# Patient Record
Sex: Female | Born: 1985 | Race: Black or African American | Hispanic: No | Marital: Single | Smoking: Never smoker
Health system: Southern US, Community
[De-identification: ages and names within clinical notes are randomized; demographics above are authoritative.]

## PROBLEM LIST (undated history)

## (undated) DIAGNOSIS — E282 Polycystic ovarian syndrome: Secondary | ICD-10-CM

---

## 2012-11-04 ENCOUNTER — Emergency Department: Payer: Self-pay

## 2012-11-04 LAB — CBC
HCT: 30 % — ABNORMAL LOW (ref 35.0–47.0)
HGB: 9.8 g/dL — ABNORMAL LOW (ref 12.0–16.0)
MCH: 28.4 pg (ref 26.0–34.0)
MCV: 87 fL (ref 80–100)
Platelet: 295 10*3/uL (ref 150–440)
RBC: 3.44 10*6/uL — ABNORMAL LOW (ref 3.80–5.20)

## 2012-11-04 LAB — URINALYSIS, COMPLETE
Bacteria: NONE SEEN
Bilirubin,UR: NEGATIVE
Leukocyte Esterase: NEGATIVE
Nitrite: NEGATIVE
Ph: 7 (ref 4.5–8.0)
Protein: NEGATIVE
Specific Gravity: 1.003 (ref 1.003–1.030)
Squamous Epithelial: <1
WBC UR: <1 /HPF (ref 0–5)

## 2013-01-21 ENCOUNTER — Ambulatory Visit: Payer: Self-pay | Admitting: Family Medicine

## 2013-01-21 DIAGNOSIS — Z0289 Encounter for other administrative examinations: Secondary | ICD-10-CM

## 2013-06-01 ENCOUNTER — Emergency Department: Payer: Self-pay | Admitting: Emergency Medicine

## 2013-06-01 LAB — CBC WITH DIFFERENTIAL/PLATELET
BASOS ABS: 0.1 10*3/uL (ref 0.0–0.1)
Basophil %: 0.5 %
EOS ABS: 0.1 10*3/uL (ref 0.0–0.7)
EOS PCT: 1.1 %
HCT: 34.1 % — ABNORMAL LOW (ref 35.0–47.0)
HGB: 11.3 g/dL — ABNORMAL LOW (ref 12.0–16.0)
LYMPHS ABS: 2.5 10*3/uL (ref 1.0–3.6)
LYMPHS PCT: 23.1 %
MCH: 27.8 pg (ref 26.0–34.0)
MCHC: 33.2 g/dL (ref 32.0–36.0)
MCV: 84 fL (ref 80–100)
Monocyte #: 0.7 x10 3/mm (ref 0.2–0.9)
Monocyte %: 6.1 %
NEUTROS ABS: 7.6 10*3/uL — AB (ref 1.4–6.5)
Neutrophil %: 69.2 %
Platelet: 265 10*3/uL (ref 150–440)
RBC: 4.06 10*6/uL (ref 3.80–5.20)
RDW: 16.4 % — ABNORMAL HIGH (ref 11.5–14.5)
WBC: 10.9 10*3/uL (ref 3.6–11.0)

## 2013-06-01 LAB — URINALYSIS, COMPLETE
Bilirubin,UR: NEGATIVE
Blood: NEGATIVE
GLUCOSE, UR: NEGATIVE mg/dL (ref 0–75)
Ketone: NEGATIVE
Leukocyte Esterase: NEGATIVE
Nitrite: NEGATIVE
Ph: 6 (ref 4.5–8.0)
Protein: 25
RBC,UR: <1 /HPF (ref 0–5)
SPECIFIC GRAVITY: 1.03 (ref 1.003–1.030)
Squamous Epithelial: 3
WBC UR: 2 /HPF (ref 0–5)

## 2013-06-01 LAB — BASIC METABOLIC PANEL
Anion Gap: 7 (ref 7–16)
BUN: 10 mg/dL (ref 7–18)
CREATININE: 0.77 mg/dL (ref 0.60–1.30)
Calcium, Total: 8.7 mg/dL (ref 8.5–10.1)
Chloride: 105 mmol/L (ref 98–107)
Co2: 25 mmol/L (ref 21–32)
EGFR (African American): >60
GLUCOSE: 74 mg/dL (ref 65–99)
Osmolality: 272 (ref 275–301)
POTASSIUM: 3.5 mmol/L (ref 3.5–5.1)
SODIUM: 137 mmol/L (ref 136–145)

## 2013-06-01 LAB — PREGNANCY, URINE: Pregnancy Test, Urine: POSITIVE m[IU]/mL

## 2013-06-01 LAB — HCG, QUANTITATIVE, PREGNANCY: BETA HCG, QUANT.: 31223 m[IU]/mL — AB

## 2013-06-01 LAB — TROPONIN I: Troponin-I: <0.02 ng/mL

## 2013-11-06 ENCOUNTER — Observation Stay: Payer: Self-pay | Admitting: Obstetrics and Gynecology

## 2013-11-22 IMAGING — US US PELV - US TRANSVAGINAL
1 series · 14 of 25 positions shown · non-contrast
Comparison: none

REASON FOR EXAM: vaginal bleeding x 2 mo
COMMENTS:

[Series 1: us pelv - us transvaginal · 0.28mm/px · 14 of 56 slices shown]
[im 1/56]
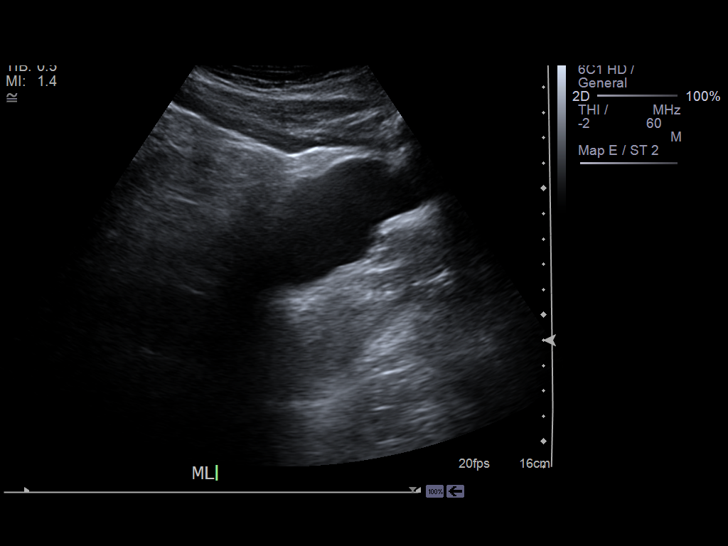
[im 5/56]
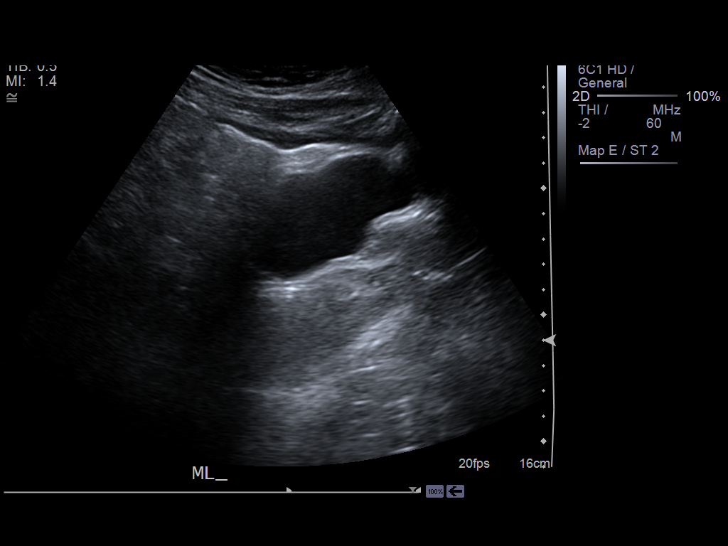
[im 10/56]
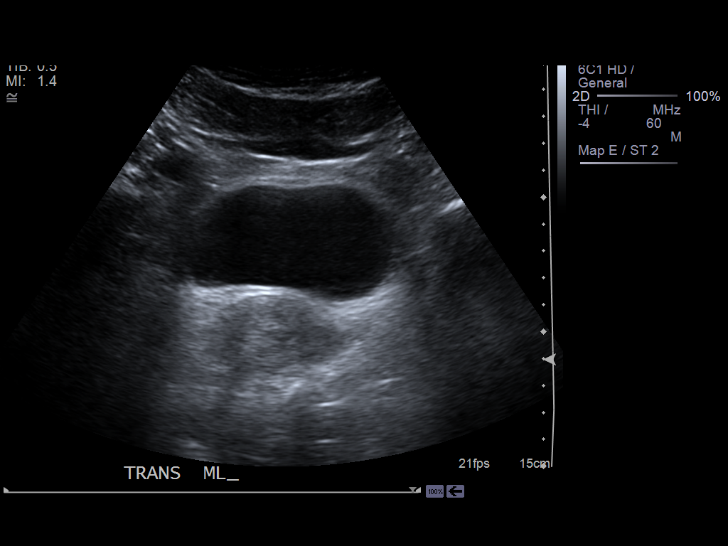
[im 14/56]
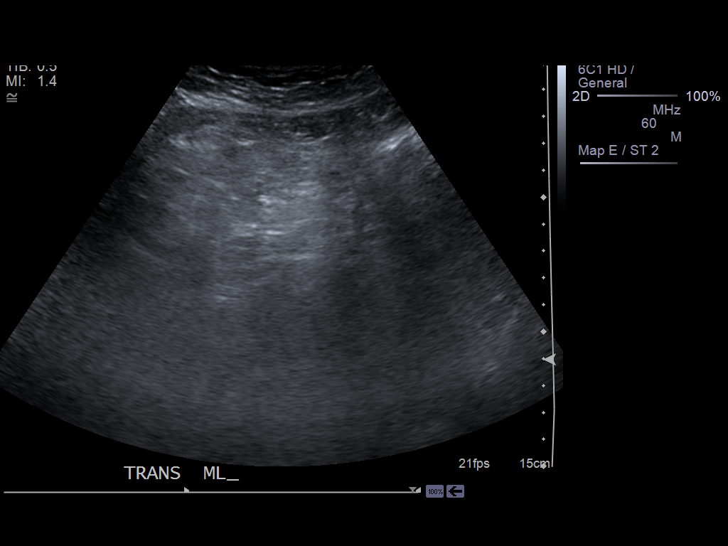
[im 19/56]
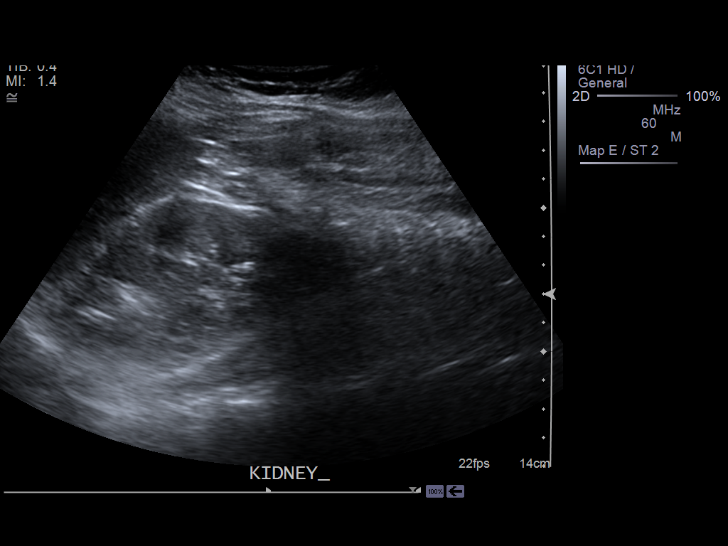
[im 21/56]
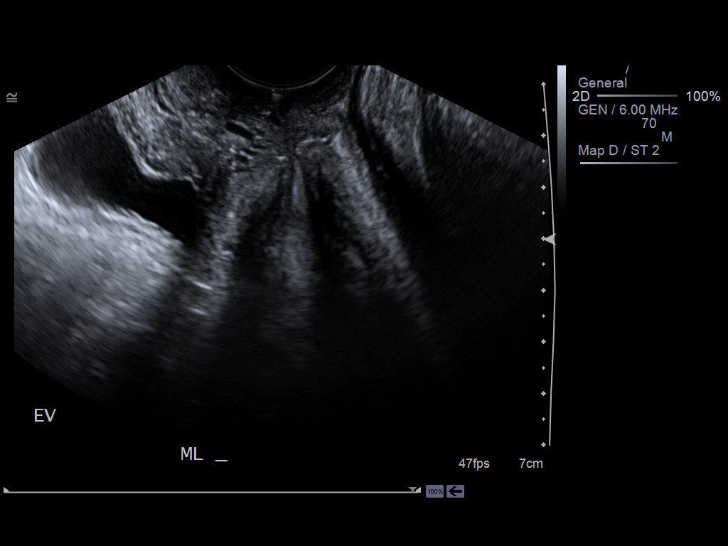
[im 26/56]
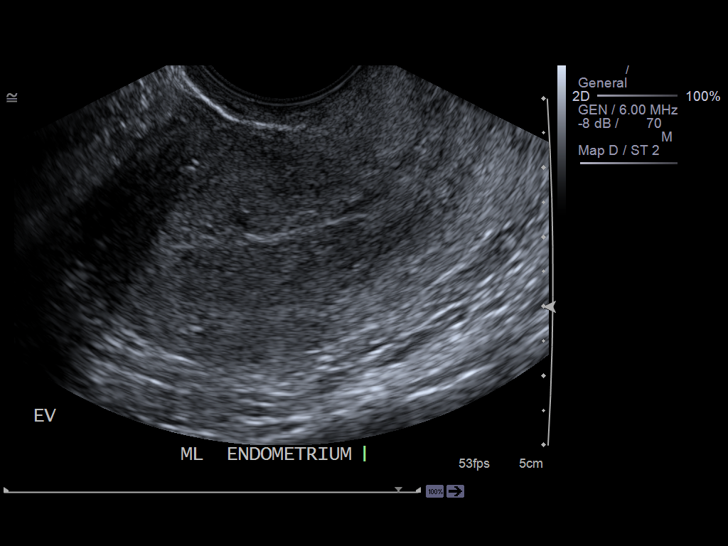
[im 30/56]
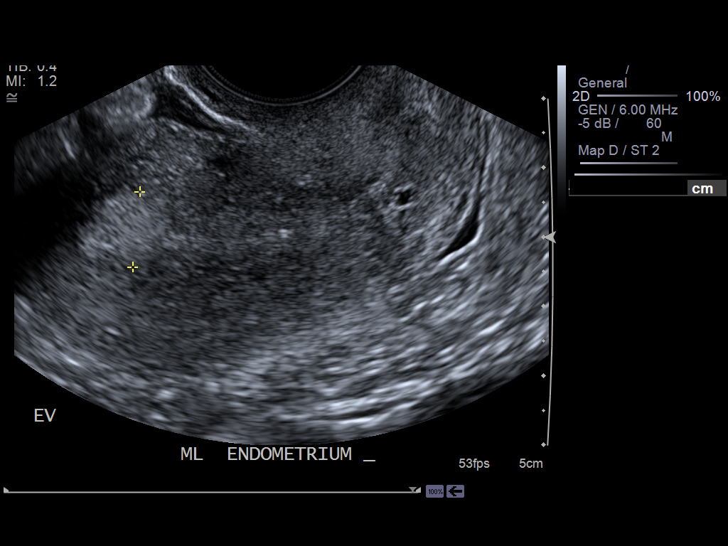
[im 35/56]
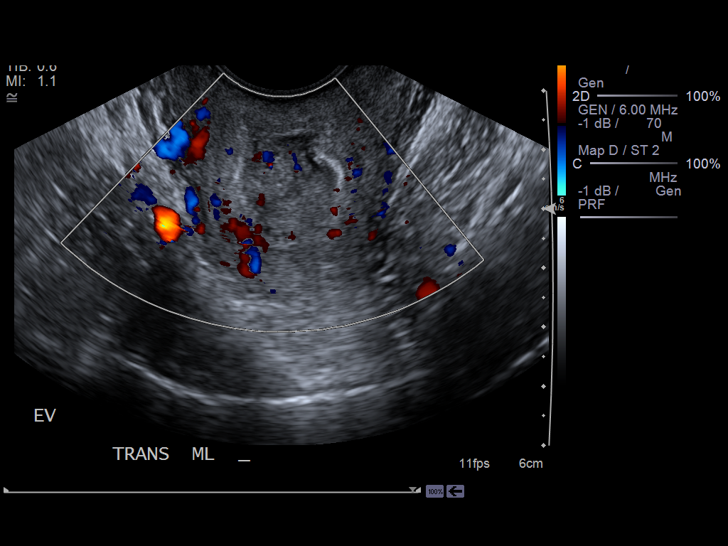
[im 37/56]
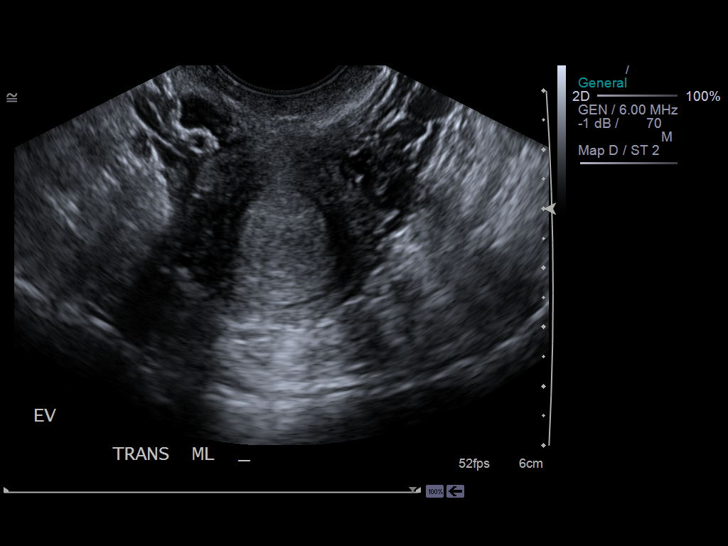
[im 42/56]
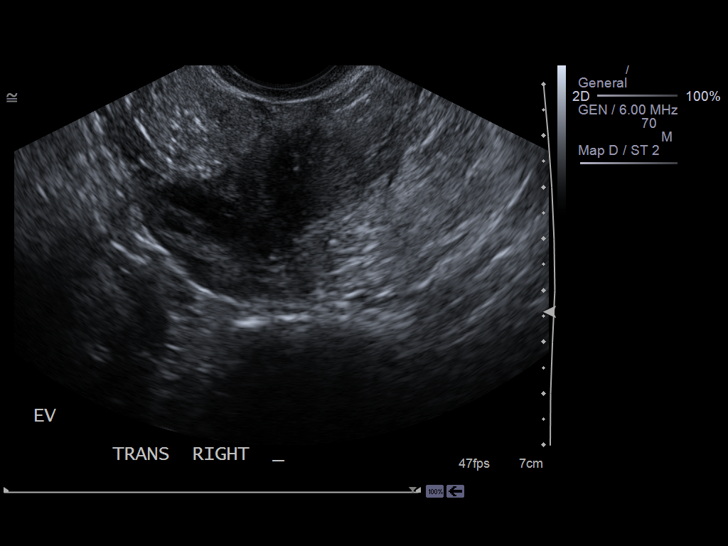
[im 46/56]
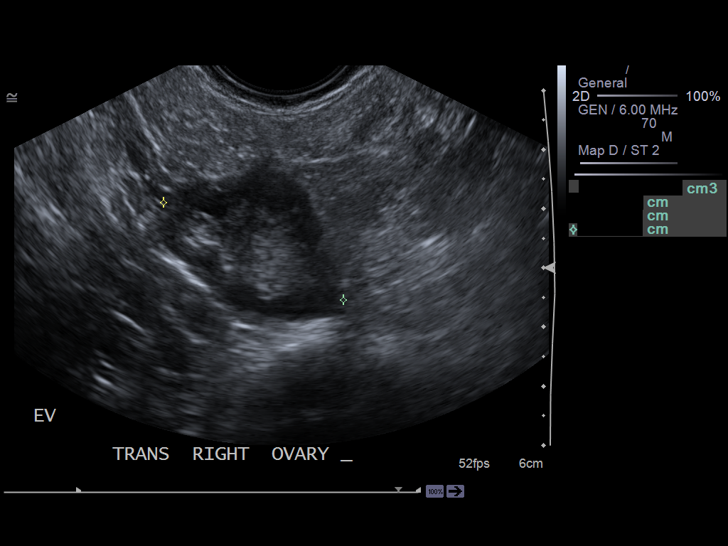
[im 51/56]
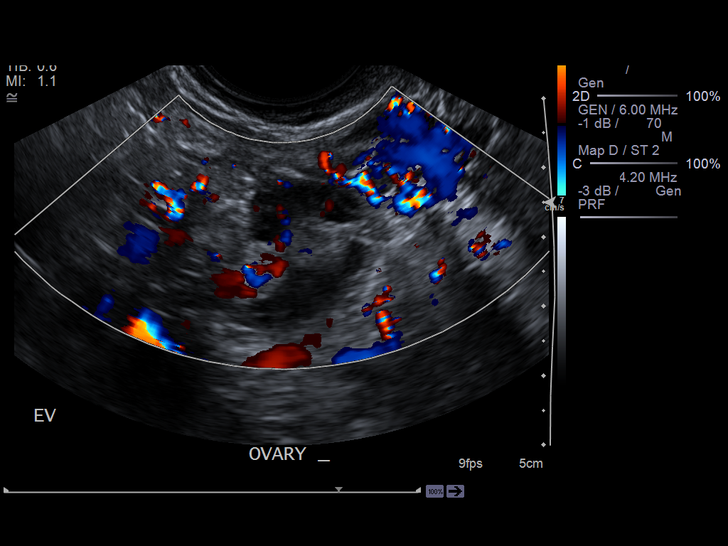
[im 56/56]
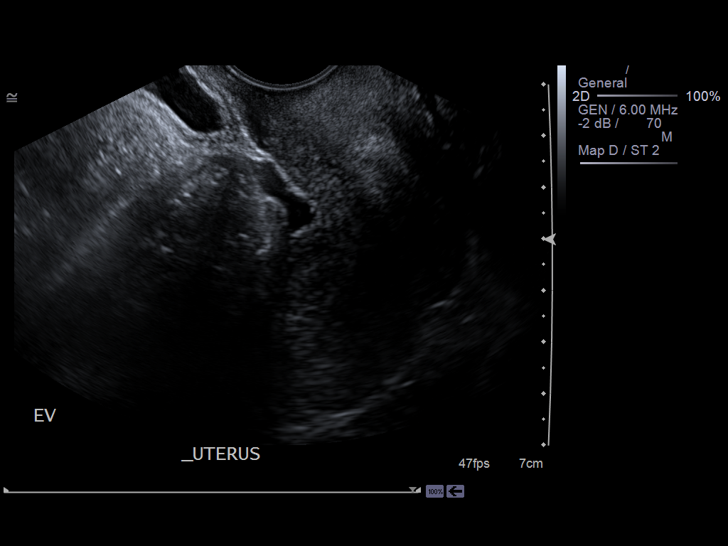

[14 of 25 positions shown; findings below may reference images not displayed]

PROCEDURE:     US  - US PELVIS EXAM W/TRANSVAGINAL  - November 04, 2012  [DATE]

RESULT:     Transabdominal and transvaginal imaging techniques were employed
evaluate the pelvis.

The uterus is normal in echotexture. It measures 6.8 x 4.5 x 3.4 cm. The
endometrium is thickened at 11 mm. There is a small amount of free fluid in
the cul-de-sac.

The right ovary measures 3 x 2.5 x 3.5 cm. The left ovary measures 3.7 x
x 2.9 cm. Vascularity of the ovaries is normal. There are multiple tiny
cysts or developing follicles in both ovaries.
IMPRESSION: 1. There is thickening of the endometrial stripe. Correlation with the
timing of the patient's menstrual cycle is needed.
2. There are multiple tiny cysts versus follicles in both ovaries. The
findings could reflect polycystic ovary syndrome in the appropriate clinical
setting.
3. There is a small amount of free fluid in the cul-de-sac.

[REDACTED]

## 2013-12-11 ENCOUNTER — Observation Stay: Payer: Self-pay | Admitting: Obstetrics and Gynecology

## 2013-12-11 LAB — URINALYSIS, COMPLETE
Bilirubin,UR: NEGATIVE
Blood: NEGATIVE
Glucose,UR: NEGATIVE mg/dL (ref 0–75)
Ketone: NEGATIVE
Nitrite: NEGATIVE
PH: 6 (ref 4.5–8.0)
Protein: NEGATIVE
SPECIFIC GRAVITY: 1.019 (ref 1.003–1.030)
Squamous Epithelial: 34
WBC UR: 11 /HPF (ref 0–5)

## 2014-01-26 ENCOUNTER — Inpatient Hospital Stay: Payer: Self-pay | Admitting: Obstetrics and Gynecology

## 2014-01-26 LAB — CBC WITH DIFFERENTIAL/PLATELET
Basophil #: 0 10*3/uL (ref 0.0–0.1)
Basophil %: 0.2 %
EOS PCT: 0.3 %
Eosinophil #: 0 10*3/uL (ref 0.0–0.7)
HCT: 38.6 % (ref 35.0–47.0)
HGB: 12.3 g/dL (ref 12.0–16.0)
LYMPHS ABS: 1.5 10*3/uL (ref 1.0–3.6)
Lymphocyte %: 14.7 %
MCH: 29.3 pg (ref 26.0–34.0)
MCHC: 31.9 g/dL — ABNORMAL LOW (ref 32.0–36.0)
MCV: 92 fL (ref 80–100)
Monocyte #: 0.6 x10 3/mm (ref 0.2–0.9)
Monocyte %: 6.4 %
Neutrophil #: 7.8 10*3/uL — ABNORMAL HIGH (ref 1.4–6.5)
Neutrophil %: 78.4 %
Platelet: 191 10*3/uL (ref 150–440)
RBC: 4.21 10*6/uL (ref 3.80–5.20)
RDW: 13.6 % (ref 11.5–14.5)
WBC: 10 10*3/uL (ref 3.6–11.0)

## 2014-01-28 LAB — HEMATOCRIT: HCT: 30.6 % — ABNORMAL LOW (ref 35.0–47.0)

## 2014-06-19 IMAGING — US US OB < 14 WEEKS - US OB TV
1 series · 14 of 28 positions shown · non-contrast
Comparison: Pelvic ultrasound November 04, 2012

CLINICAL DATA: Abdominal pain and cramping. Pregnancy, 9 weeks 5
days by last menstrual period.

EXAM:
OBSTETRIC <14 WK US AND TRANSVAGINAL OB US
TECHNIQUE: Both transabdominal and transvaginal ultrasound examinations were
performed for complete evaluation of the gestation as well as the
maternal uterus, adnexal regions, and pelvic cul-de-sac.
Transvaginal technique was performed to assess early pregnancy.

[Series 1: us ob < 14 weeks - us ob tv · 0.22mm/px · 39 acquisitions, 14 frames shown]
[im 2/39]
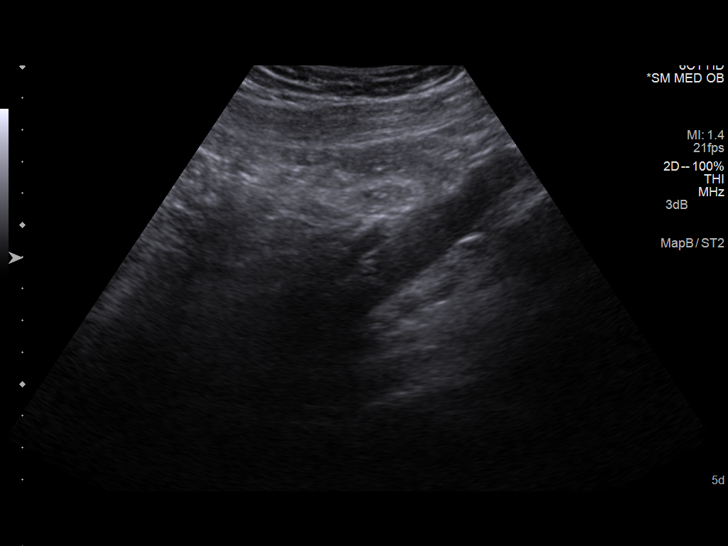
[im 5/39]
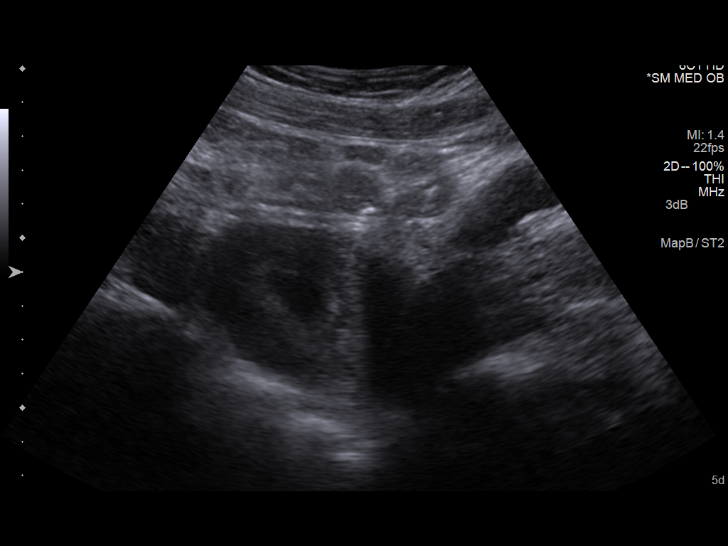
[im 8/39]
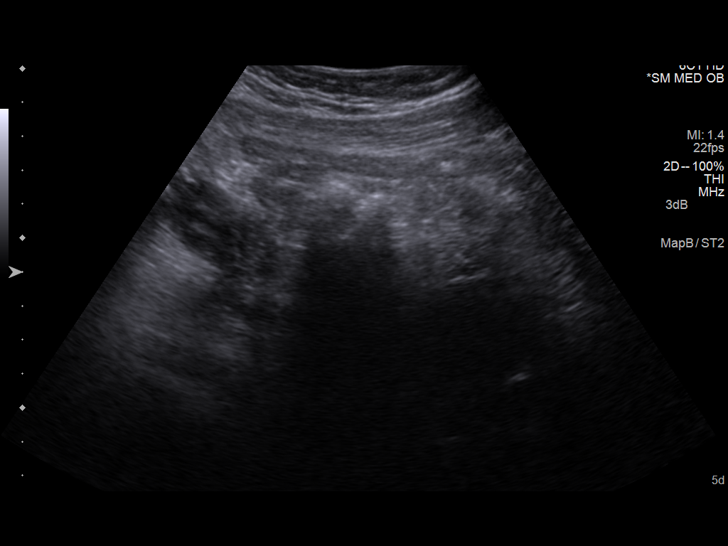
[im 10/39]
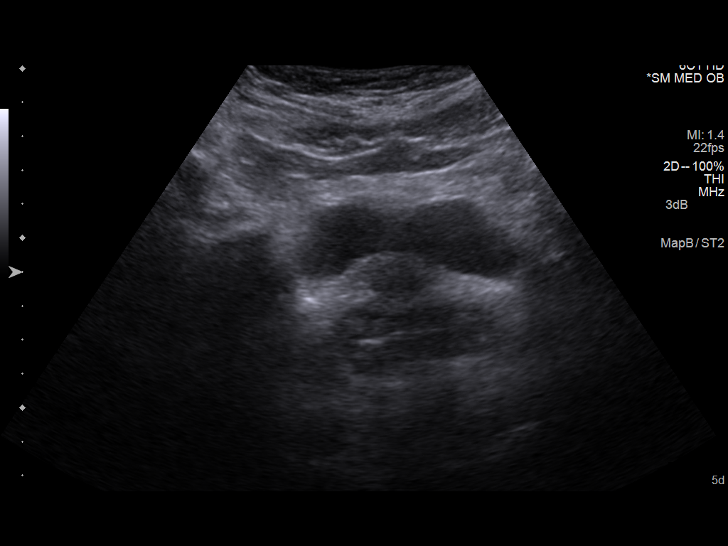
[im 13/39]
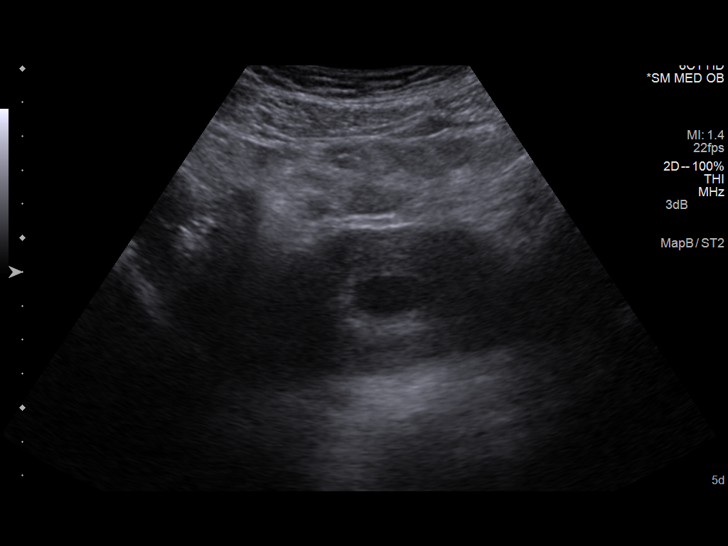
[im 16/39]
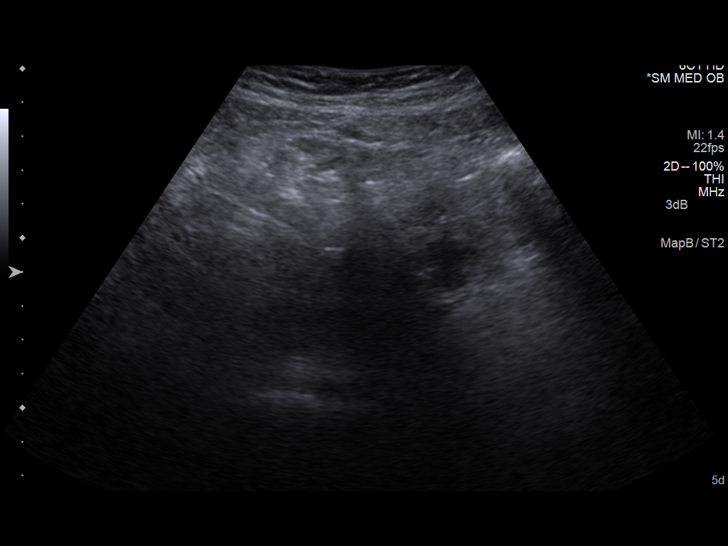
[im 19/39]
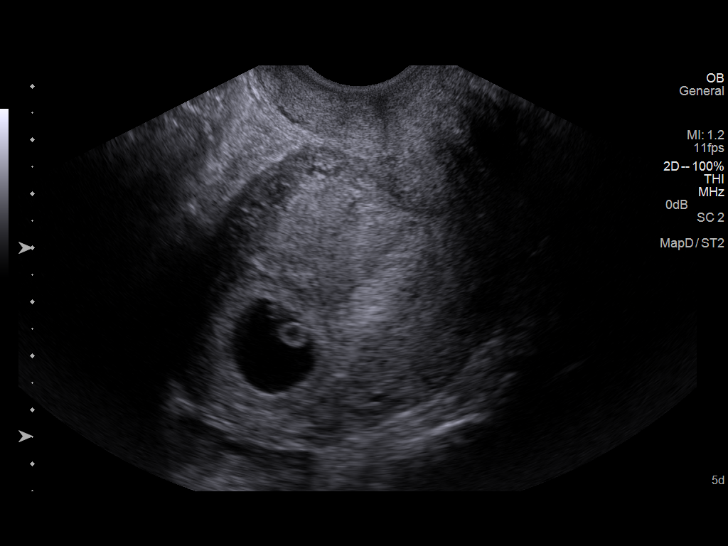
[im 22/39]
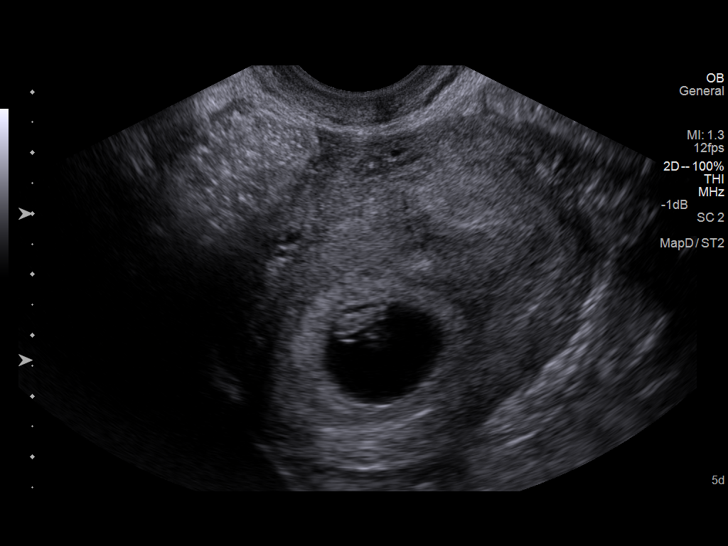
[im 24/39]
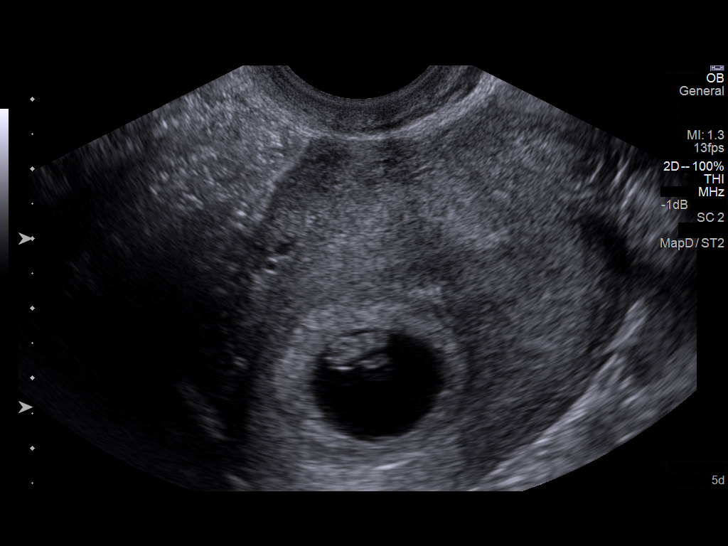
[im 27/39]
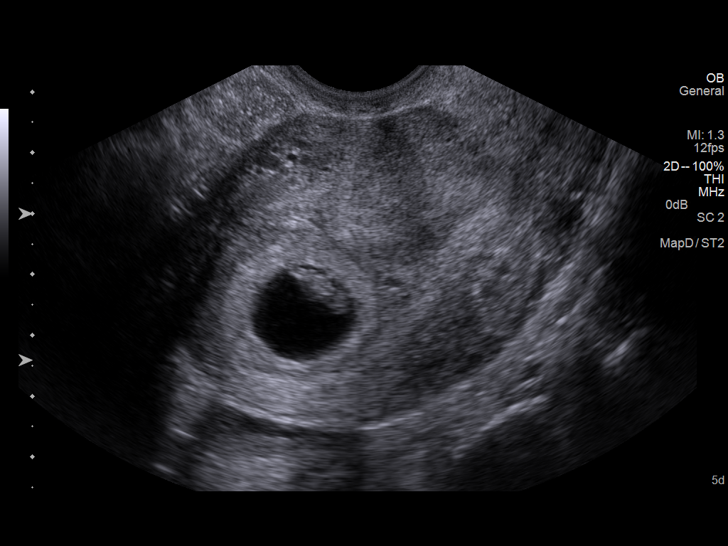
[im 30/39]
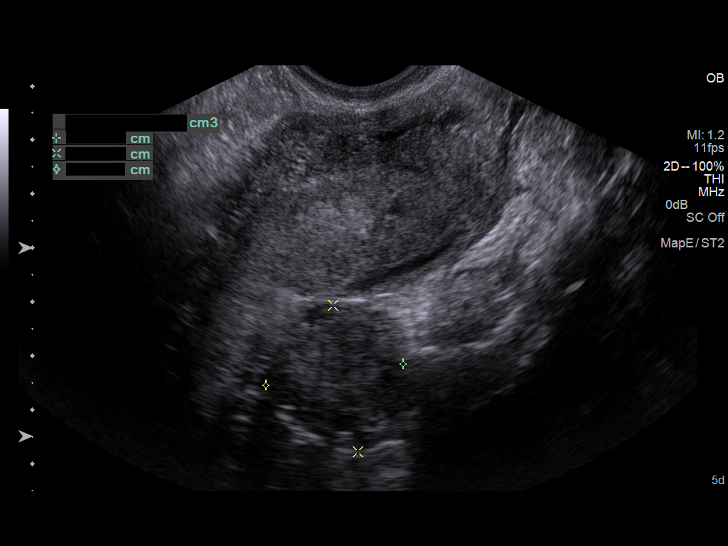
[im 33/39]
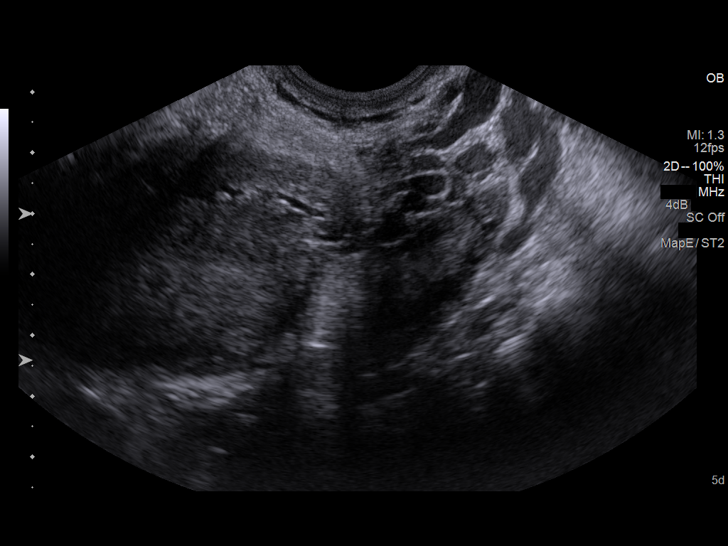
[im 36/39]
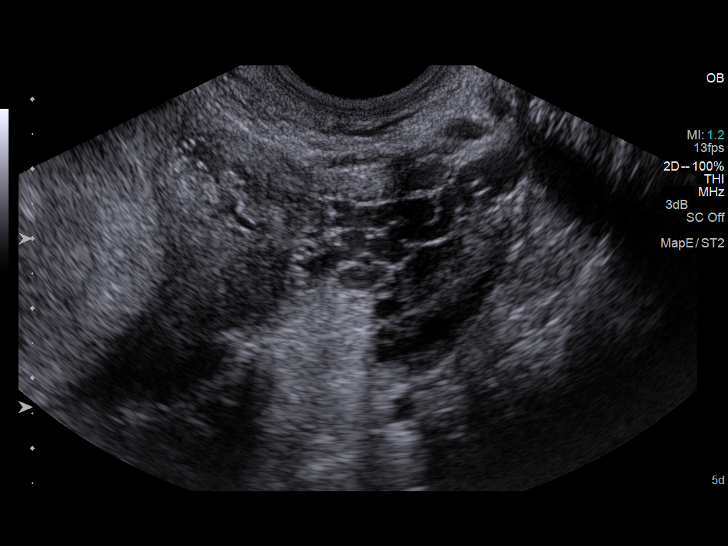
[im 39/39]
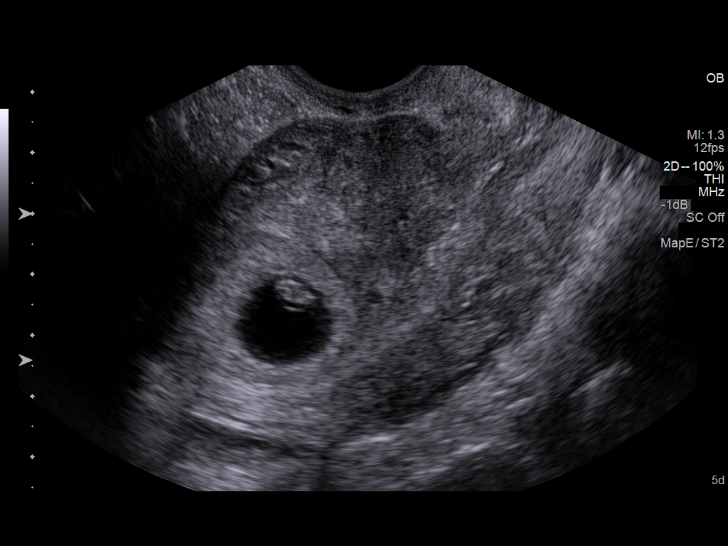

[14 of 28 positions shown; findings below may reference images not displayed]

FINDINGS: Intrauterine gestational sac: Visualized/normal in shape.

Yolk sac:  Present.

Embryo:  Present.

Cardiac Activity: Present.

Heart Rate:  135 bpm

CRL:   9  mm   6 w 6 d                  US EDC: January 19, 2014.

Maternal uterus/adnexae: Unremarkable. Trace free fluid in the
pelvic cul-de-sac.
IMPRESSION: Single live intrauterine pregnancy, 6 weeks 6 days by ultrasound. As
there is discrepancy between estimated date of delivery by last
menstrual period (9 weeks 5 days), recommend close follow-up.

  By: Noy Iii

## 2014-09-17 NOTE — Op Note (Signed)
PATIENT NAME:  Stephanie Mcclain, Stephanie Mcclain MR#:  [id number] DATE OF BIRTH:  1985-07-09  DATE OF PROCEDURE:  01/27/2014  PREOPERATIVE DIAGNOSIS: Arrest of dilation, occiput posterior fetus.   POSTOPERATIVE DIAGNOSIS: Arrest of dilation, occiput posterior fetus.   PROCEDURE PERFORMED: Primary low uterine transverse cesarean section.   ESTIMATED BLOOD LOSS: 1000 mL.   SURGEON: Elliot Gurneyarrie Mcclain Chelbie Jarnagin, MD   ASSISTANT: Tammy Brothers.  ANESTHESIA: Epidural with spinal.   FINDINGS: Female fetus 7 pounds, 13 ounces, 3530 grams with Apgars 8 and 9.   DESCRIPTION OF PROCEDURE: The patient was taken to the Operating Room and placed in supine position. After adequate anesthesia was instilled, the patient was prepped and draped in the usual sterile fashion. Timeout was performed and a Pfannenstiel skin incision was drawn approximately 2 fingerbreadths below the pubic symphysis. This was incised with the knife and carried sharply down to the fascia. The fascia was nicked in the midline. The incision was extended in a superolateral manner. The Kochers were attached to the fascia superiorly and inferiorly and the muscle bellies were sharply and bluntly dissected off the rectus fascia. The midline of the muscle bellies was identified and separated. The peritoneum was grasped, cut and entered. Bladder blade was placed. A bladder flap was created. Uterine incision was made. The infant's head was delivered. The remainder of the body was delivered. The cord was clamped and cut. Pitocin was started. Placenta delivered. Infant was handed to the awaiting pediatrician. The uterus was delivered, wrapped in a moist laparotomy sponge. The anterior of the uterus was curetted with a moist lap sponge. Pennington's were placed on the lower cervical edge and a running locked chromic suture was used to close the incision. An imbricating suture was then placed on top of this of chromic. The 3-0 chromic was then used to tack the bladder back up to the  uterine incision. The abdomen was cleansed of clots. The uterus was placed back into the abdomen. The gutters were cleared of clots. The muscle bellies were approximated with Vicryl suture. The On-Q trocars were placed. The catheters were threaded and wound between the fascia and the muscle. The fascia was closed with a running Vicryl suture. The (Dictation Anomaly) TLH suture was then used to approximate the subcutaneous fat and staples were placed to approximate the skin edges. Steri-Strips and 4 x 4's were placed on the trocar catheter entry sites along with Dermabond. Bandages placed on the incision. The fundus was firm. Clear urine was noted in the Foley bag and the patient was taken to recovery after having tolerated the procedure well.    ____________________________ Elliot Gurneyarrie Mcclain. Alece Koppel, MD cck:TT D: 02/01/2014 15:11:47 ET T: 02/01/2014 17:09:10 ET JOB#: [id number]  cc: Elliot Gurneyarrie Mcclain. Kendrick Haapala, MD, <Dictator> Elliot GurneyARRIE Mcclain Samora Jernberg MD ELECTRONICALLY SIGNED 02/03/2014 23:52

## 2014-10-04 NOTE — H&P (Signed)
L&D Evaluation:  History:  HPI 29 yo G1 at 34 weeks 4 days gestation by 6 wk ultrasound derived EDC of 01/19/2014 presenting with "cramping".  She states pain is menstrual like but also sharp, it is located in the left groin.  Exacerbated by movement and position changes.   No fevers, no chills.  No constipation, diarrhea.  Denies dysuria has had on UTI in the 1st trimester of this pregnancy but no history of recurrent UTI's. PNC thus far uncomplicated   Presents with abdominal pain   Patient's Medical History No Chronic Illness   Patient's Surgical History none   Medications Pre Natal Vitamins   Allergies NKDA   Social History none   Family History Non-Contributory   ROS:  ROS All systems were reviewed.  HEENT, CNS, GI, GU, Respiratory, CV, Renal and Musculoskeletal systems were found to be normal., unless otherwise noted in HPI   Exam:  Vital Signs 100/58, afebrile   Urine Protein not completed   General no apparent distress   Mental Status clear   Abdomen gravid, non-tender   FHT normal rate with no decels   FHT Description 150/moderate variability/+accels/no decels   Ucx absent   Skin no lesions   Impression:  Impression reactive NST, 29 yo G1 at 2975w3d presenting with round ligament pain, possible early UTI   Plan:  Plan EFM/NST   Comments 1) Round ligament pain - supportive measures including tylenol, heat discussed.  Patient reassured.  2) UA - possible early UTI +1 leuks and +1 bacteria will treat prophylactically with 7 day course of macrobid  3) Fetus - reactive category I tracing  4) Disposition discharge home   Follow Up Appointment already scheduled. 7/27   Electronic Signatures: Lorrene ReidStaebler, Karina Lenderman M (MD)  (Signed 18-Jul-15 19:13)  Authored: L&D Evaluation   Last Updated: 18-Jul-15 19:13 by Lorrene ReidStaebler, Tamari Busic M (MD)

## 2014-10-04 NOTE — H&P (Signed)
L&D Evaluation:  History Expanded:  HPI 29 yo G1 at 3129 weeks gestational age by 6 wk ultrasound presents with absent fetal movement yesterday and today. Upon attaching patient to fetal monitor, patient started noticing fetal movement.  No vaginal bleeding, no contractions.  No leakage of fluid.  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  Patient had negative 1st trim screen and AFP.   Gravida 1   Blood Type (Maternal) B positive   Group B Strep Results Maternal (Result >5wks must be treated as unknown) unknown/result > 5 weeks ago   Maternal HIV Negative   Maternal Syphilis Ab Nonreactive   Maternal Varicella Immune   Rubella Results (Maternal) immune   [facility]   Patient's Medical History No Chronic Illness   Patient's Surgical History none   Medications Pre Natal Vitamins   Allergies NKDA   Social History none   Family History Non-Contributory   ROS:  ROS All systems were reviewed.  HEENT, CNS, GI, GU, Respiratory, CV, Renal and Musculoskeletal systems were found to be normal., unless otherwise noted in HPI   Exam:  Vital Signs normotensive, afebrile   General no apparent distress   Mental Status clear   Abdomen gravid, non-tender   FHT normal rate with no decels   FHT Description 140/mod var/+10x10 accels/no decels   Ucx absent   Skin no lesions   Impression:  Impression reactive NST, 1) Intrauterine pregnancy at 7029 weeks gestational age, 2) decreased fetal movement with reassuring NST and fetal movement once she presented   Plan:  Plan EFM/NST   Comments Reassured patient.  Discussed fetal movement counts.   [facility]   Electronic Signatures: Conard NovakJackson, Caitriona Sundquist D (MD)  (Signed 13-Jun-15 09:53)  Authored: L&D Evaluation   Last Updated: 13-Jun-15 09:53 by Conard NovakJackson, Ari Engelbrecht D (MD)

## 2014-10-04 NOTE — H&P (Signed)
L&D Evaluation:  History Expanded:  HPI 29 yo G1 with EDD of 01/19/14 per 6 wk ultrasound  presents at 41wks with c/o regular contractions since this am. No LOF or VB. +FM. Plan for IOL for postdates tonight. [facility].   Blood Type (Maternal) B positive   Group B Strep Results Maternal (Result >5wks must be treated as unknown) positive   Maternal HIV Negative   Maternal Syphilis Ab Nonreactive   Maternal Varicella Immune   Rubella Results (Maternal) immune   Presents with contractions   Patient's Medical History No Chronic Illness   Patient's Surgical History none   Medications Pre Natal Vitamins   Allergies NKDA   Social History none   Family History Non-Contributory   ROS:  ROS All systems were reviewed.  HEENT, CNS, GI, GU, Respiratory, CV, Renal and Musculoskeletal systems were found to be normal., unless otherwise noted in HPI   Exam:  Vital Signs stable   Urine Protein not completed   General no apparent distress   Mental Status clear   Abdomen gravid, tender with contractions   Estimated Fetal Weight Average for gestational age   Pelvic no external lesions, 3/80/-2   Mebranes Intact   FHT Category 1 tracing except as noted below   Ucx regular, q 3-10 minutes   Skin no lesions   Impression:  Impression early labor   Plan:  Plan EFM/NST, antibiotics for GBBS prophylaxis   Comments Pt given option to return tomorrow for IOL or proceed with induction this afternoon once unit census allows. Pt opted to stay. Shortly after decision, FHR with several variable decels, will give IV fluid bolus and continue to monitor.   Electronic Signatures: Vella KohlerBrothers, Pollie Poma K (CNM)  (Signed 02-Sep-15 14:09)  Authored: L&D Evaluation   Last Updated: 02-Sep-15 14:09 by Vella KohlerBrothers, Adwoa Axe K (CNM)

## 2022-07-28 ENCOUNTER — Encounter: Payer: Self-pay | Admitting: Intensive Care

## 2022-07-28 ENCOUNTER — Emergency Department
Admission: EM | Admit: 2022-07-28 | Discharge: 2022-07-28 | Disposition: A | Discharge status: Home / Self Care | Payer: Self-pay | Attending: Emergency Medicine | Admitting: Emergency Medicine

## 2022-07-28 ENCOUNTER — Other Ambulatory Visit: Payer: Self-pay

## 2022-07-28 DIAGNOSIS — K0889 Other specified disorders of teeth and supporting structures: Secondary | ICD-10-CM | POA: Insufficient documentation

## 2022-07-28 HISTORY — DX: Polycystic ovarian syndrome: E28.2

## 2022-07-28 MED ORDER — CHLORHEXIDINE GLUCONATE 0.12 % MT SOLN: 15.0000 mL | Freq: Two times a day (BID) | OROMUCOSAL | 0 refills | Status: AC

## 2022-07-28 MED ORDER — NAPROXEN 500 MG PO TABS: 500.0000 mg | ORAL_TABLET | Freq: Two times a day (BID) | ORAL | 0 refills | Status: AC

## 2022-07-28 MED ORDER — AMOXICILLIN-POT CLAVULANATE 875-125 MG PO TABS: 1.0000 | ORAL_TABLET | Freq: Two times a day (BID) | ORAL | 0 refills | Status: AC

## 2022-07-28 MED ORDER — KETOROLAC TROMETHAMINE 15 MG/ML IJ SOLN
15.0000 mg | Freq: Once | INTRAMUSCULAR | Status: AC
  Administered 2022-07-28: 15 mg via INTRAMUSCULAR
  Filled 2022-07-28: qty 1

## 2022-07-28 NOTE — Discharge Instructions (Signed)
You may take the medications as prescribed to help with your pain.  Please follow-up with your dentist as you have scheduled.  Please return for any new, worsening, or change in symptoms or other concerns.  It was a pleasure caring for you today.

## 2022-07-28 NOTE — ED Provider Notes (Signed)
[facility] Provider Note    Event Date/Time   First MD Initiated Contact with Patient 07/28/22 1039     (approximate)   History   Dental Pain   HPI  Stephanie Mcclain is a 37 y.o. female with no reported past medical history presents today for evaluation of left posterior dental pain.  Patient reports that this has been hurting her for the past couple of weeks, and she has an appointment to get her tooth removed this week.  However, her pain worsened last night.  She denies facial or neck pain or swelling.  No trouble swallowing.  She reports that she has pain with eating, though she is able to drink.  No fevers or chills.  There are no problems to display for this patient.         Physical Exam   Triage Vital Signs: ED Triage Vitals  Enc Vitals Group     BP 07/28/22 1035 128/69     Pulse Rate 07/28/22 1035 64     Resp 07/28/22 1035 18     Temp 07/28/22 1035 97.7 F (36.5 C)     Temp Source 07/28/22 1035 Oral     SpO2 07/28/22 1035 100 %     Weight 07/28/22 1036 171 lb (77.6 kg)     Height 07/28/22 1036 '5\' 1"'$  (1.549 m)     Head Circumference --      Peak Flow --      Pain Score 07/28/22 1035 10     Pain Loc --      Pain Edu? --      Excl. in [place]? --     Most recent vital signs: Vitals:   07/28/22 1035  BP: 128/69  Pulse: 64  Resp: 18  Temp: 97.7 F (36.5 C)  SpO2: 100%    Physical Exam Vitals and nursing note reviewed.  Constitutional:      General: Awake and alert. No acute distress.    Appearance: Normal appearance. The patient is normal weight.  HENT:     Head: Normocephalic and atraumatic.     Mouth: Mucous membranes are moist.  Left upper posterior molars with mild tap tenderness.  Mild gingival erythema without fluctuance.  No trismus.  No sublingual swelling.  No facial or neck swelling or erythema Eyes:     General: PERRL. Normal EOMs        Right eye: No discharge.        Left eye: No discharge.      Conjunctiva/sclera: Conjunctivae normal.  Cardiovascular:     Rate and Rhythm: Normal rate and regular rhythm.     Pulses: Normal pulses.  Pulmonary:     Effort: Pulmonary effort is normal. No respiratory distress.     Breath sounds: Normal breath sounds.  Abdominal:     Abdomen is soft. There is no abdominal tenderness.  Musculoskeletal:        General: No swelling. Normal range of motion.     Cervical back: Normal range of motion and neck supple.  Skin:    General: Skin is warm and dry.     Capillary Refill: Capillary refill takes less than 2 seconds.     Findings: No rash.  Neurological:     Mental Status: The patient is awake and alert.      ED Results / Procedures / Treatments   Labs (all labs ordered are listed, but only abnormal results are displayed) Labs Reviewed - No  data to display   EKG     RADIOLOGY     PROCEDURES:  Critical Care performed:   Procedures   MEDICATIONS ORDERED IN ED: Medications  ketorolac (TORADOL) 15 MG/ML injection 15 mg (15 mg Intramuscular Given 07/28/22 1047)     IMPRESSION / MDM / ASSESSMENT AND PLAN / ED COURSE  I reviewed the triage vital signs and the nursing notes.   Differential diagnosis includes, but is not limited to, dental caries, pulpitis, gingivitis, dental decay.  Patient was evaluated in the emergency department for dental pain. Patient has tenderness over 1 of her teeth, I suspect some dental caries vs pulpitits. No gingival swelling or fluctuance concerning for gingival abscess.  No trismus, nuchal rigidity, neck pain, hot potato voice, uvular deviation or malocclusion to suggest deep space infection. No sublingual swelling concerning for Ludwig's angina.  Patient was started on antibiotics and chlorhexidine mouth rinse.  She reports that there is no chance of pregnancy and she declines pregnancy test.  She understands the risk of taking these medications if she is pregnant.  Patient was treated symptomatically  in the emergency department. Discussed care plan, return precautions, and advised close outpatient follow-up with dentist. Patient agrees with plan of care.  Patient's presentation is most consistent with acute complicated illness / injury requiring diagnostic workup.    FINAL CLINICAL IMPRESSION(S) / ED DIAGNOSES   Final diagnoses:  Pain, dental     Rx / DC Orders   ED Discharge Orders          Ordered    amoxicillin-clavulanate (AUGMENTIN) 875-125 MG tablet  2 times daily        07/28/22 1043    chlorhexidine (PERIDEX) 0.12 % solution  2 times daily        07/28/22 1043    naproxen (NAPROSYN) 500 MG tablet  2 times daily with meals        07/28/22 1043             Note:  This document was prepared using Dragon voice recognition software and may include unintentional dictation errors.   Marquette Old, PA-C 07/28/22 1408    Nathaniel Man, MD 07/28/22 1520

## 2022-07-28 NOTE — ED Triage Notes (Signed)
Patient c/o dental pain that started yesterday
